# Patient Record
Sex: Male | Born: 1985 | Race: White | Hispanic: No | Marital: Single | Smoking: Never smoker
Health system: Southern US, Community
[De-identification: ages and names within clinical notes are randomized; demographics above are authoritative.]

## PROBLEM LIST (undated history)

## (undated) ENCOUNTER — Emergency Department (HOSPITAL_COMMUNITY): Admission: EM | Payer: Managed Care, Other (non HMO) | Source: Home / Self Care

---

## 2020-12-11 ENCOUNTER — Institutional Professional Consult (permissible substitution): Payer: Self-pay | Admitting: Plastic Surgery

## 2020-12-23 ENCOUNTER — Other Ambulatory Visit: Payer: Self-pay

## 2020-12-24 ENCOUNTER — Encounter: Payer: Self-pay | Admitting: Emergency Medicine

## 2020-12-24 ENCOUNTER — Ambulatory Visit: Payer: BC Managed Care – PPO | Admitting: Emergency Medicine

## 2020-12-24 VITALS — BP 120/80 | HR 61 | Temp 98.4°F | Ht 73.0 in | Wt 204.0 lb

## 2020-12-24 DIAGNOSIS — N62 Hypertrophy of breast: Secondary | ICD-10-CM | POA: Diagnosis not present

## 2020-12-24 DIAGNOSIS — Z8669 Personal history of other diseases of the nervous system and sense organs: Secondary | ICD-10-CM

## 2020-12-24 DIAGNOSIS — Z7689 Persons encountering health services in other specified circumstances: Secondary | ICD-10-CM | POA: Diagnosis not present

## 2020-12-24 NOTE — Patient Instructions (Signed)

## 2020-12-24 NOTE — Assessment & Plan Note (Signed)
Well-controlled stable.  Responsive to triptan medications.

## 2020-12-24 NOTE — Assessment & Plan Note (Signed)
Needs evaluation by plastic surgery for possible breast tissue reduction

## 2020-12-24 NOTE — Progress Notes (Signed)
Cody Bell 35 y.o.   Chief Complaint  Patient presents with  . New Patient (Initial Visit)   Date: 10/28/2020 Patient name: Cody Bell Date of birth: 01-26-1986  ASSESSMENT/PLAN:  Bryton was seen today for annual exam.  Diagnoses and all orders for this visit:  Encounter for annual physical exam - Doing well. Labs today. Vaccines UTD.  - CBC; Future - Comprehensive Metabolic Panel; Future - Lipid Panel With LDL/HDL Ratio; Future - TSH; Future - Urinalysis Complete with Microscopic; Future  Encounter for vitamin deficiency screening - Vitamin D 25 Hydroxy; Future  Gynecomastia - Chronic x 10 years. Exam overall okay but gynecomastia is present. Will refer to plastic surgery  - Testosterone, free & total; Future - hCG, Qualitative; Future - Ambulatory referral to Plastic Surgery  Follow up in about 1 year (around 10/28/2021) for annual physical.   HISTORY OF PRESENT ILLNESS: This is a 35 y.o. male first visit to this office here to establish care with me. Past medical history includes history of migraine headaches responsive to triptans. Also has history of painful gynecomastia. Non-smoker.  Healthy male with a healthy lifestyle.  HPI   Prior to Admission medications   Medication Sig Start Date End Date Taking? Authorizing Provider  AMINO ACIDS PO Take by mouth daily.   Yes [provider]  GLUCOSAMINE-CHONDROITIN PO Take by mouth daily.   Yes [provider]  Multiple Vitamin (MULTIVITAMIN) tablet Take 1 tablet by mouth daily.   Yes [provider]  Omega-3 Fatty Acids (FISH OIL) 1200 MG CAPS Take by mouth daily.   Yes [provider]  rizatriptan (MAXALT) 10 MG tablet Take 10 mg by mouth as needed for migraine. May repeat in 2 hours if needed   Yes [provider]    Allergies  Allergen Reactions  . Metoclopramide Hives  . Sulfa Antibiotics Hives  . Cephalosporins Rash  . Isosulfan Blue Rash    There  are no problems to display for this patient.   History reviewed. No pertinent past medical history.  History reviewed. No pertinent surgical history.  Social History   Socioeconomic History  . Marital status: Single    Spouse name: Not on file  . Number of children: Not on file  . Years of education: Not on file  . Highest education level: Not on file  Occupational History  . Not on file  Tobacco Use  . Smoking status: Never Smoker  . Smokeless tobacco: Never Used  Substance and Sexual Activity  . Alcohol use: Not Currently  . Drug use: Never  . Sexual activity: Not on file  Other Topics Concern  . Not on file  Social History Narrative  . Not on file   Social Determinants of Health   Financial Resource Strain: Not on file  Food Insecurity: Not on file  Transportation Needs: Not on file  Physical Activity: Not on file  Stress: Not on file  Social Connections: Not on file  Intimate Partner Violence: Not on file    History reviewed. No pertinent family history.   Review of Systems  Constitutional: Negative.  Negative for chills and fever.  HENT: Negative.  Negative for congestion and sore throat.   Respiratory: Negative.  Negative for cough and shortness of breath.   Cardiovascular: Negative.  Negative for chest pain and palpitations.  Gastrointestinal: Negative.  Negative for abdominal pain, diarrhea, nausea and vomiting.  Genitourinary: Negative.  Negative for dysuria and hematuria.  Skin: Negative.  Negative for rash.  Neurological: Negative.  Negative for dizziness and headaches.  All other systems reviewed and are negative.    Physical Exam Vitals reviewed.  Constitutional:      Appearance: Normal appearance.  HENT:     Head: Normocephalic.  Eyes:     Extraocular Movements: Extraocular movements intact.     Conjunctiva/sclera: Conjunctivae normal.     Pupils: Pupils are equal, round, and reactive to light.  Cardiovascular:     Rate and Rhythm:  Normal rate and regular rhythm.     Pulses: Normal pulses.     Heart sounds: Normal heart sounds.  Pulmonary:     Effort: Pulmonary effort is normal.     Breath sounds: Normal breath sounds.  Abdominal:     General: Bowel sounds are normal. There is no distension.     Palpations: Abdomen is soft.     Tenderness: There is no abdominal tenderness.  Musculoskeletal:        General: Normal range of motion.     Cervical back: Normal range of motion and neck supple.  Skin:    General: Skin is warm and dry.     Capillary Refill: Capillary refill takes less than 2 seconds.  Neurological:     General: No focal deficit present.     Mental Status: He is alert and oriented to person, place, and time.  Psychiatric:        Mood and Affect: Mood normal.        Behavior: Behavior normal.      ASSESSMENT & PLAN: A total of 30 minutes was spent with the patient and counseling/coordination of care regarding establishing care with me, review of past medical history, review of medications, need for plastic surgery consult for gynecomastia, education on nutrition, review of most recent office visit note with [facility], health maintenance items, prognosis, documentation, and need for follow-up.  History of migraine Well-controlled stable.  Responsive to triptan medications.  Gynecomastia, male Needs evaluation by plastic surgery for possible breast tissue reduction  [name] was seen today for new patient (initial visit).  Diagnoses and all orders for this visit:  Gynecomastia, male -     Ambulatory referral to Plastic Surgery  Encounter to establish care  History of migraine    Patient Instructions   Health Maintenance, Male Adopting a healthy lifestyle and getting preventive care are important in promoting health and wellness. Ask your health care provider about:  The right schedule for you to have regular tests and exams.  Things you can do on your own to prevent  diseases and keep yourself healthy. What should I know about diet, weight, and exercise? Eat a healthy diet  Eat a diet that includes plenty of vegetables, fruits, low-fat dairy products, and lean protein.  Do not eat a lot of foods that are high in solid fats, added sugars, or sodium.   Maintain a healthy weight Body mass index (BMI) is a measurement that can be used to identify possible weight problems. It estimates body fat based on height and weight. Your health care provider can help determine your BMI and help you achieve or maintain a healthy weight. Get regular exercise Get regular exercise. This is one of the most important things you can do for your health. Most adults should:  Exercise for at least 150 minutes each week. The exercise should increase your heart rate and make you sweat (moderate-intensity exercise).  Do strengthening exercises at least twice a week. This is in addition  to the moderate-intensity exercise.  Spend less time sitting. Even light physical activity can be beneficial. Watch cholesterol and blood lipids Have your blood tested for lipids and cholesterol at 34 years of age, then have this test every 5 years. You may need to have your cholesterol levels checked more often if:  Your lipid or cholesterol levels are high.  You are older than 34 years of age.  You are at high risk for heart disease. What should I know about cancer screening? Many types of cancers can be detected early and may often be prevented. Depending on your health history and family history, you may need to have cancer screening at various ages. This may include screening for:  Colorectal cancer.  Prostate cancer.  Skin cancer.  Lung cancer. What should I know about heart disease, diabetes, and high blood pressure? Blood pressure and heart disease  High blood pressure causes heart disease and increases the risk of stroke. This is more likely to develop in people who have high  blood pressure readings, are of African descent, or are overweight.  Talk with your health care provider about your target blood pressure readings.  Have your blood pressure checked: ? Every 3-5 years if you are 4418-34 years of age. ? Every year if you are 34 years old or older.  If you are between the ages of 9565 and 6475 and are a current or former smoker, ask your health care provider if you should have a one-time screening for abdominal aortic aneurysm (AAA). Diabetes Have regular diabetes screenings. This checks your fasting blood sugar level. Have the screening done:  Once every three years after age 34 if you are at a normal weight and have a low risk for diabetes.  More often and at a younger age if you are overweight or have a high risk for diabetes. What should I know about preventing infection? Hepatitis B If you have a higher risk for hepatitis B, you should be screened for this virus. Talk with your health care provider to find out if you are at risk for hepatitis B infection. Hepatitis C Blood testing is recommended for:  Everyone born from [birth year] through 1965.  Anyone with known risk factors for hepatitis C. Sexually transmitted infections (STIs)  You should be screened each year for STIs, including gonorrhea and chlamydia, if: ? You are sexually active and are younger than 34 years of age. ? You are older than 34 years of age and your health care provider tells you that you are at risk for this type of infection. ? Your sexual activity has changed since you were last screened, and you are at increased risk for chlamydia or gonorrhea. Ask your health care provider if you are at risk.  Ask your health care provider about whether you are at high risk for HIV. Your health care provider may recommend a prescription medicine to help prevent HIV infection. If you choose to take medicine to prevent HIV, you should first get tested for HIV. You should then be tested every 3 months for  as long as you are taking the medicine. Follow these instructions at home: Lifestyle  Do not use any products that contain nicotine or tobacco, such as cigarettes, e-cigarettes, and chewing tobacco. If you need help quitting, ask your health care provider.  Do not use street drugs.  Do not share needles.  Ask your health care provider for help if you need support or information about quitting drugs. Alcohol use  Do not drink alcohol if your health care provider tells you not to drink.  If you drink alcohol: ? Limit how much you have to 0-2 drinks a day. ? Be aware of how much alcohol is in your drink. In the [place] one drink equals one 12 oz bottle of beer (355 mL), one 5 oz glass of wine (148 mL), or one 1 oz glass of hard liquor (44 mL). General instructions  Schedule regular health, dental, and eye exams.  Stay current with your vaccines.  Tell your health care provider if: ? You often feel depressed. ? You have ever been abused or do not feel safe at home. Summary  Adopting a healthy lifestyle and getting preventive care are important in promoting health and wellness.  Follow your health care provider's instructions about healthy diet, exercising, and getting tested or screened for diseases.  Follow your health care provider's instructions on monitoring your cholesterol and blood pressure. This information is not intended to replace advice given to you by your health care provider. Make sure you discuss any questions you have with your health care provider. Document Revised: 06/28/2018 Document Reviewed: 06/28/2018 Elsevier Patient Education  2021 Elsevier Inc.     Edwina Barth, MD [facility] at [facility]

## 2020-12-29 ENCOUNTER — Encounter: Payer: Self-pay | Admitting: Emergency Medicine

## 2021-01-01 ENCOUNTER — Other Ambulatory Visit: Payer: Self-pay | Admitting: Emergency Medicine

## 2021-01-01 DIAGNOSIS — R399 Unspecified symptoms and signs involving the genitourinary system: Secondary | ICD-10-CM

## 2021-01-01 NOTE — Telephone Encounter (Signed)
Chronic lower urinary tract symptoms for years.  Possible overactive bladder.  Needs evaluation by urologist.  Referral placed today.  Thanks.

## 2021-01-16 ENCOUNTER — Encounter: Payer: Self-pay | Admitting: Emergency Medicine

## 2021-01-19 ENCOUNTER — Other Ambulatory Visit: Payer: Self-pay | Admitting: Emergency Medicine

## 2021-01-19 MED ORDER — RIZATRIPTAN BENZOATE 10 MG PO TABS: 10.0000 mg | ORAL_TABLET | ORAL | 10 refills | Status: DC | PRN

## 2021-01-19 NOTE — Telephone Encounter (Signed)
Prescription sent to pharmacy of record.  Thanks.

## 2021-01-29 ENCOUNTER — Institutional Professional Consult (permissible substitution): Payer: BC Managed Care – PPO | Admitting: Plastic Surgery

## 2021-06-26 ENCOUNTER — Encounter: Payer: Self-pay | Admitting: Emergency Medicine

## 2021-07-01 NOTE — Telephone Encounter (Signed)
Called patient and left vm stating to callback to schedule appointment.

## 2021-10-10 ENCOUNTER — Encounter (HOSPITAL_COMMUNITY): Payer: Self-pay | Admitting: Emergency Medicine

## 2021-10-10 ENCOUNTER — Emergency Department (HOSPITAL_COMMUNITY)
Admission: EM | Admit: 2021-10-10 | Discharge: 2021-10-10 | Disposition: A | Discharge status: Home / Self Care | Payer: Managed Care, Other (non HMO) | Attending: Emergency Medicine | Admitting: Emergency Medicine

## 2021-10-10 ENCOUNTER — Emergency Department (HOSPITAL_COMMUNITY): Payer: Managed Care, Other (non HMO)

## 2021-10-10 ENCOUNTER — Other Ambulatory Visit: Payer: Self-pay

## 2021-10-10 DIAGNOSIS — W134XXA Fall from, out of or through window, initial encounter: Secondary | ICD-10-CM | POA: Insufficient documentation

## 2021-10-10 DIAGNOSIS — S2241XA Multiple fractures of ribs, right side, initial encounter for closed fracture: Secondary | ICD-10-CM | POA: Insufficient documentation

## 2021-10-10 DIAGNOSIS — Y9339 Activity, other involving climbing, rappelling and jumping off: Secondary | ICD-10-CM | POA: Insufficient documentation

## 2021-10-10 DIAGNOSIS — S299XXA Unspecified injury of thorax, initial encounter: Secondary | ICD-10-CM | POA: Diagnosis present

## 2021-10-10 MED ORDER — HYDROCODONE-ACETAMINOPHEN 5-325 MG PO TABS: 1.0000 | ORAL_TABLET | Freq: Four times a day (QID) | ORAL | 0 refills | Status: DC | PRN

## 2021-10-10 NOTE — Discharge Instructions (Addendum)
You have been seen and discharged from the emergency department.  Your x-ray showed hairline rib fractures.  Take 600 to 800 mg of ibuprofen every 6-8 hours with food and drink.  Take stronger pain medicine as needed.  Do not mix this medication with alcohol or other sedating medications. Do not drive or do heavy physical activity until you know how this medication affects you.  It may cause drowsiness..  Follow-up with your primary provider for further evaluation and further care. Take home medications as prescribed. If you have any worsening symptoms, worsening pain, shortness of breath, coughing up blood or further concerns for your health please return to an emergency department for further evaluation. ?

## 2021-10-10 NOTE — ED Notes (Signed)
Pt verbalizes understanding of discharge instructions. Opportunity for questions and answers were provided. Pt discharged from the ED.   ?
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
## 2021-10-12 ENCOUNTER — Telehealth: Payer: Managed Care, Other (non HMO) | Admitting: Emergency Medicine

## 2021-10-12 ENCOUNTER — Other Ambulatory Visit: Payer: Self-pay

## 2021-10-12 ENCOUNTER — Telehealth: Payer: Self-pay | Admitting: Emergency Medicine

## 2021-10-12 NOTE — Telephone Encounter (Signed)
Phone call. No answer.

## 2021-10-16 ENCOUNTER — Encounter: Payer: Self-pay | Admitting: Family Medicine

## 2021-10-16 ENCOUNTER — Telehealth (INDEPENDENT_AMBULATORY_CARE_PROVIDER_SITE_OTHER): Payer: Managed Care, Other (non HMO) | Admitting: Family Medicine

## 2021-10-16 DIAGNOSIS — S2241XA Multiple fractures of ribs, right side, initial encounter for closed fracture: Secondary | ICD-10-CM | POA: Diagnosis not present

## 2021-10-16 MED ORDER — HYDROCODONE-ACETAMINOPHEN 10-325 MG PO TABS: 1.0000 | ORAL_TABLET | Freq: Three times a day (TID) | ORAL | 0 refills | Status: AC | PRN
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
## 2021-10-28 ENCOUNTER — Telehealth: Payer: Self-pay | Admitting: Emergency Medicine

## 2021-10-29 ENCOUNTER — Ambulatory Visit: Payer: Managed Care, Other (non HMO) | Admitting: Family Medicine

## 2021-12-29 ENCOUNTER — Telehealth (INDEPENDENT_AMBULATORY_CARE_PROVIDER_SITE_OTHER): Payer: Managed Care, Other (non HMO) | Admitting: Emergency Medicine

## 2021-12-29 ENCOUNTER — Encounter: Payer: Self-pay | Admitting: Emergency Medicine

## 2021-12-29 DIAGNOSIS — T25221A Burn of second degree of right foot, initial encounter: Secondary | ICD-10-CM

## 2021-12-29 MED ORDER — AZITHROMYCIN 250 MG PO TABS: ORAL_TABLET | ORAL | 0 refills | Status: DC

## 2021-12-29 MED ORDER — TRAMADOL HCL 50 MG PO TABS: 50.0000 mg | ORAL_TABLET | Freq: Three times a day (TID) | ORAL | 0 refills | Status: AC | PRN

## 2021-12-29 MED ORDER — SILVER SULFADIAZINE 1 % EX CREA: 1.0000 "application " | TOPICAL_CREAM | Freq: Every day | CUTANEOUS | 1 refills | Status: DC

## 2021-12-29 NOTE — Telephone Encounter (Signed)
Thank you.  I referred him also to wound care center.  Strongly advise he follows up on that.
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
## 2022-01-07 ENCOUNTER — Telehealth: Payer: Managed Care, Other (non HMO) | Admitting: Emergency Medicine

## 2022-01-27 ENCOUNTER — Telehealth: Payer: Self-pay | Admitting: Emergency Medicine

## 2022-01-27 ENCOUNTER — Other Ambulatory Visit: Payer: Self-pay | Admitting: Emergency Medicine

## 2022-01-27 MED ORDER — RIZATRIPTAN BENZOATE 10 MG PO TABS
10.0000 mg | ORAL_TABLET | ORAL | 10 refills | Status: DC | PRN
Start: 2022-01-27 — End: 2022-08-09

## 2022-01-27 NOTE — Telephone Encounter (Signed)
Caller & Relationship to patient: Andrik - self  Call back number: [phone]  Date of last office visit: 12/29/21  Date of next office visit:   Medication(s) to be refilled: rizatriptan (MAXALT) 10 MG tablet  Preferred Pharmacy: [facility] [address] [place], [place] - [address] [facility] Phone:  [phone]  Fax:  [phone]    Patient is requesting 10 refills be sent and a call from medical assistant when refill has been sent.

## 2022-01-27 NOTE — Telephone Encounter (Signed)
New prescription sent to pharmacy on record.  Thanks.

## 2022-02-11 ENCOUNTER — Other Ambulatory Visit: Payer: Self-pay | Admitting: Emergency Medicine

## 2022-02-11 DIAGNOSIS — T25221A Burn of second degree of right foot, initial encounter: Secondary | ICD-10-CM

## 2022-02-18 ENCOUNTER — Encounter: Payer: Self-pay | Admitting: Family Medicine

## 2022-02-18 ENCOUNTER — Other Ambulatory Visit: Payer: Self-pay | Admitting: Emergency Medicine

## 2022-02-18 ENCOUNTER — Encounter: Payer: Self-pay | Admitting: Emergency Medicine

## 2022-02-18 ENCOUNTER — Telehealth: Payer: Managed Care, Other (non HMO)

## 2022-02-18 MED ORDER — VALACYCLOVIR HCL 1 G PO TABS: 1000.0000 mg | ORAL_TABLET | Freq: Two times a day (BID) | ORAL | 1 refills | Status: DC

## 2022-02-18 NOTE — Telephone Encounter (Signed)
New prescription sent to pharmacy of record

## 2022-02-18 NOTE — Telephone Encounter (Signed)
Message was handled by PCP

## 2022-05-02 ENCOUNTER — Encounter: Payer: Self-pay | Admitting: Emergency Medicine

## 2022-05-11 ENCOUNTER — Encounter: Payer: Managed Care, Other (non HMO) | Admitting: Emergency Medicine

## 2022-05-25 ENCOUNTER — Ambulatory Visit (INDEPENDENT_AMBULATORY_CARE_PROVIDER_SITE_OTHER): Payer: Managed Care, Other (non HMO) | Admitting: Emergency Medicine

## 2022-05-25 ENCOUNTER — Encounter: Payer: Self-pay | Admitting: Emergency Medicine

## 2022-05-25 VITALS — BP 108/72 | HR 76 | Temp 98.1°F | Ht 73.0 in | Wt 200.2 lb

## 2022-05-25 DIAGNOSIS — Z114 Encounter for screening for human immunodeficiency virus [HIV]: Secondary | ICD-10-CM

## 2022-05-25 DIAGNOSIS — Z13220 Encounter for screening for lipoid disorders: Secondary | ICD-10-CM

## 2022-05-25 DIAGNOSIS — Z130 Encounter for screening for diseases of the blood and blood-forming organs and certain disorders involving the immune mechanism: Secondary | ICD-10-CM | POA: Diagnosis not present

## 2022-05-25 DIAGNOSIS — Z0000 Encounter for general adult medical examination without abnormal findings: Secondary | ICD-10-CM | POA: Diagnosis not present

## 2022-05-25 DIAGNOSIS — Z13228 Encounter for screening for other metabolic disorders: Secondary | ICD-10-CM

## 2022-05-25 DIAGNOSIS — Z1329 Encounter for screening for other suspected endocrine disorder: Secondary | ICD-10-CM

## 2022-05-25 DIAGNOSIS — Z1159 Encounter for screening for other viral diseases: Secondary | ICD-10-CM | POA: Diagnosis not present

## 2022-05-25 DIAGNOSIS — N62 Hypertrophy of breast: Secondary | ICD-10-CM

## 2022-05-25 NOTE — Progress Notes (Signed)
Cody Bell 36 y.o.   Chief Complaint  Patient presents with   Annual Exam    Surgical clearance, patient needs a note stating that he is cleared     HISTORY OF PRESENT ILLNESS: This is a 36 y.o. male here for annual exam. History of bilateral gynecomastia.  Will be scheduled for surgery.  Needs referral to [facility]. Overall doing well. No chronic medical conditions.  No chronic medications. Recent cardiac evaluation due to family history of PFO revealed normal heart findings.  HPI   Prior to Admission medications   Medication Sig Start Date End Date Taking? Authorizing Provider  AMINO ACIDS PO Take by mouth daily.    [provider]  azithromycin (ZITHROMAX) 250 MG tablet Sig as indicated 12/29/21   Horald Pollen, MD  GLUCOSAMINE-CHONDROITIN PO Take by mouth daily.    [provider]  Multiple Vitamin (MULTIVITAMIN) tablet Take 1 tablet by mouth daily.    [provider]  Omega-3 Fatty Acids (FISH OIL) 1200 MG CAPS Take by mouth daily.    [provider]  rizatriptan (MAXALT) 10 MG tablet Take 1 tablet (10 mg total) by mouth as needed for migraine. May repeat in 2 hours if needed 01/27/22   Horald Pollen, MD  SSD 1 % cream APPLY TOPICALLY ONCE DAILY 02/11/22   Horald Pollen, MD  valACYclovir (VALTREX) 1000 MG tablet Take 1 tablet (1,000 mg total) by mouth 2 (two) times daily. 02/18/22   Horald Pollen, MD    Allergies  Allergen Reactions   Metoclopramide Hives   Sulfa Antibiotics Hives   Cephalosporins Rash   Isosulfan Blue Rash    Patient Active Problem List   Diagnosis Date Noted   Gynecomastia, male 12/24/2020   History of migraine 12/24/2020    No past medical history on file.  No past surgical history on file.  Social History   Socioeconomic History   Marital status: Single    Spouse name: Not on file   Number of children: Not on file   Years of education: Not on file    Highest education level: Not on file  Occupational History   Not on file  Tobacco Use   Smoking status: Never   Smokeless tobacco: Never  Substance and Sexual Activity   Alcohol use: Not Currently   Drug use: Never   Sexual activity: Not on file  Other Topics Concern   Not on file  Social History Narrative   Not on file   Social Determinants of Health   Financial Resource Strain: Not on file  Food Insecurity: Not on file  Transportation Needs: Not on file  Physical Activity: Not on file  Stress: Not on file  Social Connections: Not on file  Intimate Partner Violence: Not on file    No family history on file.   Review of Systems  Constitutional: Negative.  Negative for chills and fever.  HENT: Negative.  Negative for congestion and sore throat.   Respiratory: Negative.  Negative for cough and shortness of breath.   Cardiovascular: Negative.  Negative for chest pain and palpitations.  Gastrointestinal: Negative.  Negative for abdominal pain, diarrhea, nausea and vomiting.  Genitourinary: Negative.   Skin: Negative.  Negative for rash.  Neurological: Negative.  Negative for dizziness and tingling.  All other systems reviewed and are negative.   Today's Vitals   05/25/22 1309  BP: 108/72  Pulse: 76  Temp: 98.1 F (36.7 C)  TempSrc: Oral  SpO2: 97%  Weight: 200 lb 4 oz (90.8 kg)  Height: 6\' 1"  (1.854 m)   Body mass index is 26.42 kg/m.  Physical Exam Vitals reviewed.  Constitutional:      Appearance: Normal appearance.  HENT:     Head: Normocephalic.     Right Ear: Tympanic membrane, ear canal and external ear normal.     Left Ear: Tympanic membrane, ear canal and external ear normal.     Mouth/Throat:     Mouth: Mucous membranes are moist.     Pharynx: Oropharynx is clear.  Eyes:     Extraocular Movements: Extraocular movements intact.     Conjunctiva/sclera: Conjunctivae normal.     Pupils: Pupils are equal, round, and reactive to light.   Cardiovascular:     Rate and Rhythm: Normal rate and regular rhythm.     Pulses: Normal pulses.     Heart sounds: Normal heart sounds.  Pulmonary:     Effort: Pulmonary effort is normal.     Breath sounds: Normal breath sounds.  Abdominal:     Palpations: Abdomen is soft.     Tenderness: There is no abdominal tenderness.  Musculoskeletal:        General: Normal range of motion.     Cervical back: No tenderness.     Right lower leg: No edema.     Left lower leg: No edema.  Lymphadenopathy:     Cervical: No cervical adenopathy.  Skin:    General: Skin is warm and dry.     Comments: Bilateral gynecomastia  Neurological:     General: No focal deficit present.     Mental Status: He is alert and oriented to person, place, and time.  Psychiatric:        Mood and Affect: Mood normal.        Behavior: Behavior normal.      ASSESSMENT & PLAN: Problem List Items Addressed This Visit       Other   Gynecomastia, male   Relevant Orders   Ambulatory referral to Plastic Surgery   Other Visit Diagnoses     Routine general medical examination at a health care facility    -  Primary   Screening for HIV (human immunodeficiency virus)       Relevant Orders   HIV antibody   Need for hepatitis C screening test       Relevant Orders   Hepatitis C antibody screen   Screening for deficiency anemia       Relevant Orders   CBC with Differential   Screening for lipoid disorders       Relevant Orders   Lipid panel   Screening for endocrine, metabolic and immunity disorder       Relevant Orders   Comprehensive metabolic panel   Hemoglobin A1c      Modifiable risk factors discussed with patient. Anticipatory guidance according to age provided. The following topics were also discussed: Social Determinants of Health Smoking.  Non-smoker Diet and nutrition.  Good eating habits Benefits of exercise.  Exercises regularly.  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 Ask your health care provider about: The right schedule for you to have regular tests and exams. Things you can do on your  own to prevent diseases and keep yourself healthy. What should I know about diet, weight, and exercise? Eat a healthy diet  Eat a diet that includes plenty of vegetables, fruits, low-fat dairy products, and lean protein. Do not eat a lot of foods that are high in solid fats, added sugars, or sodium. Maintain a healthy weight Body mass index (BMI) is a measurement that can be used to identify possible weight problems. It estimates body fat based on height and weight. Your health care provider can help determine your BMI and help you achieve or maintain a healthy weight. Get regular exercise Get regular exercise. This is one of the most important things you can do for your health. Most adults should: Exercise for at least 150 minutes each week. The exercise should increase your heart rate and make you sweat (moderate-intensity exercise). Do strengthening exercises at least twice a week. This is in addition to the moderate-intensity exercise. Spend less time sitting. Even light physical activity can be beneficial. Watch cholesterol and blood lipids Have your blood tested for lipids and cholesterol at 35 years of age, then have this test every 5 years. You may need to have your cholesterol levels checked more often if: Your lipid or cholesterol levels are high. You are older than 35 years of age. You are at high risk for heart disease. What should I know about cancer screening? Many types of cancers can be detected early and may often be prevented. Depending on  your health history and family history, you may need to have cancer screening at various ages. This may include screening for: Colorectal cancer. Prostate cancer. Skin cancer. Lung cancer. What should I know about heart disease, diabetes, and high blood pressure? Blood pressure and heart disease High blood pressure causes heart disease and increases the risk of stroke. This is more likely to develop in people who have high blood pressure readings or are overweight. Talk with your health care provider about your target blood pressure readings. Have your blood pressure checked: Every 3-5 years if you are 70-9 years of age. Every year if you are 39 years old or older. If you are between the ages of 47 and 94 and are a current or former smoker, ask your health care provider if you should have a one-time screening for abdominal aortic aneurysm (AAA). Diabetes Have regular diabetes screenings. This checks your fasting blood sugar level. Have the screening done: Once every three years after age 67 if you are at a normal weight and have a low risk for diabetes. More often and at a younger age if you are overweight or have a high risk for diabetes. What should I know about preventing infection? Hepatitis B If you have a higher risk for hepatitis B, you should be screened for this virus. Talk with your health care provider to find out if you are at risk for hepatitis B infection. Hepatitis C Blood testing is recommended for: Everyone born from [birth year] through 1964. Anyone with known risk factors for hepatitis C. Sexually transmitted infections (STIs) You should be screened each year for STIs, including gonorrhea and chlamydia, if: You are sexually active and are younger than 35 years of age. You are older than 35 years of age and your health care provider tells you that you are at risk for this type of infection. Your sexual activity has changed since you were last screened, and you are at increased  risk for chlamydia or gonorrhea. Ask your health care provider  if you are at risk. Ask your health care provider about whether you are at high risk for HIV. Your health care provider may recommend a prescription medicine to help prevent HIV infection. If you choose to take medicine to prevent HIV, you should first get tested for HIV. You should then be tested every 3 months for as long as you are taking the medicine. Follow these instructions at home: Alcohol use Do not drink alcohol if your health care provider tells you not to drink. If you drink alcohol: Limit how much you have to 0-2 drinks a day. Know how much alcohol is in your drink. In the [place] one drink equals one 12 oz bottle of beer (355 mL), one 5 oz glass of wine (148 mL), or one 1 oz glass of hard liquor (44 mL). Lifestyle Do not use any products that contain nicotine or tobacco. These products include cigarettes, chewing tobacco, and vaping devices, such as e-cigarettes. If you need help quitting, ask your health care provider. Do not use street drugs. Do not share needles. Ask your health care provider for help if you need support or information about quitting drugs. General instructions Schedule regular health, dental, and eye exams. Stay current with your vaccines. Tell your health care provider if: You often feel depressed. You have ever been abused or do not feel safe at home. Summary Adopting a healthy lifestyle and getting preventive care are important in promoting health and wellness. Follow your health care provider's instructions about healthy diet, exercising, and getting tested or screened for diseases. Follow your health care provider's instructions on monitoring your cholesterol and blood pressure. This information is not intended to replace advice given to you by your health care provider. Make sure you discuss any questions you have with your health care provider. Document Revised: 11/24/2020 Document  Reviewed: 11/24/2020 Elsevier Patient Education  [place], MD [facility]

## 2022-05-25 NOTE — Patient Instructions (Signed)
Health Maintenance, Male Adopting a healthy lifestyle and getting preventive care are important in promoting health and wellness. Ask your health care provider about: The right schedule for you to have regular tests and exams. Things you can do on your own to prevent diseases and keep yourself healthy. What should I know about diet, weight, and exercise? Eat a healthy diet  Eat a diet that includes plenty of vegetables, fruits, low-fat dairy products, and lean protein. Do not eat a lot of foods that are high in solid fats, added sugars, or sodium. Maintain a healthy weight Body mass index (BMI) is a measurement that can be used to identify possible weight problems. It estimates body fat based on height and weight. Your health care provider can help determine your BMI and help you achieve or maintain a healthy weight. Get regular exercise Get regular exercise. This is one of the most important things you can do for your health. Most adults should: Exercise for at least 150 minutes each week. The exercise should increase your heart rate and make you sweat (moderate-intensity exercise). Do strengthening exercises at least twice a week. This is in addition to the moderate-intensity exercise. Spend less time sitting. Even light physical activity can be beneficial. Watch cholesterol and blood lipids Have your blood tested for lipids and cholesterol at 35 years of age, then have this test every 5 years. You may need to have your cholesterol levels checked more often if: Your lipid or cholesterol levels are high. You are older than 35 years of age. You are at high risk for heart disease. What should I know about cancer screening? Many types of cancers can be detected early and may often be prevented. Depending on your health history and family history, you may need to have cancer screening at various ages. This may include screening for: Colorectal cancer. Prostate cancer. Skin cancer. Lung  cancer. What should I know about heart disease, diabetes, and high blood pressure? Blood pressure and heart disease High blood pressure causes heart disease and increases the risk of stroke. This is more likely to develop in people who have high blood pressure readings or are overweight. Talk with your health care provider about your target blood pressure readings. Have your blood pressure checked: Every 3-5 years if you are 18-39 years of age. Every year if you are 40 years old or older. If you are between the ages of 65 and 75 and are a current or former smoker, ask your health care provider if you should have a one-time screening for abdominal aortic aneurysm (AAA). Diabetes Have regular diabetes screenings. This checks your fasting blood sugar level. Have the screening done: Once every three years after age 45 if you are at a normal weight and have a low risk for diabetes. More often and at a younger age if you are overweight or have a high risk for diabetes. What should I know about preventing infection? Hepatitis B If you have a higher risk for hepatitis B, you should be screened for this virus. Talk with your health care provider to find out if you are at risk for hepatitis B infection. Hepatitis C Blood testing is recommended for: Everyone born from 1945 through 1965. Anyone with known risk factors for hepatitis C. Sexually transmitted infections (STIs) You should be screened each year for STIs, including gonorrhea and chlamydia, if: You are sexually active and are younger than 35 years of age. You are older than 35 years of age and your   health care provider tells you that you are at risk for this type of infection. Your sexual activity has changed since you were last screened, and you are at increased risk for chlamydia or gonorrhea. Ask your health care provider if you are at risk. Ask your health care provider about whether you are at high risk for HIV. Your health care provider  may recommend a prescription medicine to help prevent HIV infection. If you choose to take medicine to prevent HIV, you should first get tested for HIV. You should then be tested every 3 months for as long as you are taking the medicine. Follow these instructions at home: Alcohol use Do not drink alcohol if your health care provider tells you not to drink. If you drink alcohol: Limit how much you have to 0-2 drinks a day. Know how much alcohol is in your drink. In the [place] one drink equals one 12 oz bottle of beer (355 mL), one 5 oz glass of wine (148 mL), or one 1 oz glass of hard liquor (44 mL). Lifestyle Do not use any products that contain nicotine or tobacco. These products include cigarettes, chewing tobacco, and vaping devices, such as e-cigarettes. If you need help quitting, ask your health care provider. Do not use street drugs. Do not share needles. Ask your health care provider for help if you need support or information about quitting drugs. General instructions Schedule regular health, dental, and eye exams. Stay current with your vaccines. Tell your health care provider if: You often feel depressed. You have ever been abused or do not feel safe at home. Summary Adopting a healthy lifestyle and getting preventive care are important in promoting health and wellness. Follow your health care provider's instructions about healthy diet, exercising, and getting tested or screened for diseases. Follow your health care provider's instructions on monitoring your cholesterol and blood pressure. This information is not intended to replace advice given to you by your health care provider. Make sure you discuss any questions you have with your health care provider. Document Revised: 11/24/2020 Document Reviewed: 11/24/2020 Elsevier Patient Education  2023 [employer] Inc[employer]  

## 2022-07-29 ENCOUNTER — Telehealth: Payer: Self-pay | Admitting: Emergency Medicine

## 2022-07-29 ENCOUNTER — Other Ambulatory Visit: Payer: Self-pay | Admitting: Emergency Medicine

## 2022-07-29 ENCOUNTER — Encounter: Payer: Self-pay | Admitting: Emergency Medicine

## 2022-07-29 DIAGNOSIS — N62 Hypertrophy of breast: Secondary | ICD-10-CM

## 2022-07-29 NOTE — Telephone Encounter (Signed)
Orders for labs requested placed today.  Thanks.

## 2022-07-29 NOTE — Telephone Encounter (Signed)
Patient called and stated he spoke with his insurance, [employer], in regards to them denying his request for a surgery. He states that he needs his primary doctor to supply them with a blood panel to check that his thyroid and hormones are level before he can have the surgery. He states that his primary would need to call the mainline number for [employer] to do a peer-to-peer talk so that they could overturn the decision. If there are any questions the patient can be reached at [phone].

## 2022-07-29 NOTE — Telephone Encounter (Signed)
Called patient and informed him that the labs are in

## 2022-07-29 NOTE — Telephone Encounter (Signed)
Called patient for clarification on below message. Insurance is requesting hormonal  labs to be drawn before surgery can be approved.   These labs are Hypothyrodism, estrogen excess, prolatinomas, hypogonadism. Also needs a referral to endocrinology. I informed patient the endocrinology is scheduling out a few months. He states the referral to endo is ok to do, wants to know if provider can order labs  in the meantime

## 2022-08-02 ENCOUNTER — Other Ambulatory Visit: Payer: Managed Care, Other (non HMO)

## 2022-08-02 DIAGNOSIS — N62 Hypertrophy of breast: Secondary | ICD-10-CM

## 2022-08-06 ENCOUNTER — Encounter: Payer: Self-pay | Admitting: Emergency Medicine

## 2022-08-09 MED ORDER — RIZATRIPTAN BENZOATE 10 MG PO TABS: 10.0000 mg | ORAL_TABLET | ORAL | 12 refills | Status: DC | PRN

## 2022-08-09 NOTE — Addendum Note (Signed)
Addended by: Rae Mar on: 08/09/2022 02:36 PM   Modules accepted: Orders

## 2022-08-11 LAB — TESTT+TESTF+SHBG
Sex Hormone Binding: 31.4 nmol/L (ref 16.5–55.9)
Testosterone, Free: 6 pg/mL — ABNORMAL LOW (ref 8.7–25.1)
Testosterone, Total, LC/MS: 341.8 ng/dL (ref 264.0–916.0)

## 2022-08-11 LAB — TSH: TSH: 2.14 u[IU]/mL (ref 0.450–4.500)

## 2022-08-11 LAB — ESTROGENS, TOTAL: Estrogen: 150 pg/mL (ref 56–213)

## 2022-08-11 LAB — PROLACTIN: Prolactin: 20.2 ng/mL (ref 3.9–22.7)

## 2022-12-01 ENCOUNTER — Encounter: Payer: Self-pay | Admitting: Emergency Medicine

## 2022-12-08 ENCOUNTER — Ambulatory Visit: Payer: Managed Care, Other (non HMO) | Admitting: Emergency Medicine

## 2022-12-26 ENCOUNTER — Encounter: Payer: Self-pay | Admitting: Emergency Medicine

## 2022-12-27 ENCOUNTER — Other Ambulatory Visit: Payer: Self-pay | Admitting: Emergency Medicine

## 2022-12-27 ENCOUNTER — Other Ambulatory Visit: Payer: Self-pay | Admitting: *Deleted

## 2022-12-27 MED ORDER — ACYCLOVIR 800 MG PO TABS: 800.0000 mg | ORAL_TABLET | Freq: Four times a day (QID) | ORAL | 1 refills | Status: DC

## 2022-12-27 MED ORDER — VALACYCLOVIR HCL 1 G PO TABS: 1000.0000 mg | ORAL_TABLET | Freq: Two times a day (BID) | ORAL | 0 refills | Status: DC

## 2022-12-27 NOTE — Telephone Encounter (Signed)
Prescription sent to wrong pharmacy, please send to: [facility] [phone]  Phone: [phone]  Fax: [phone]

## 2022-12-27 NOTE — Telephone Encounter (Signed)
New prescription for acyclovir sent to pharmacy of record today.  Thanks.

## 2022-12-27 NOTE — Telephone Encounter (Signed)
Patient is requesting a different medication than the Valtrex. He does not want the Valacydovir. Please send to [facility] - [address] [place], [place] - [address]   Call back if any questions - [phone]

## 2022-12-27 NOTE — Telephone Encounter (Signed)
New prescription for valacyclovir sent to pharmacy of record.  Thanks.

## 2023-04-03 ENCOUNTER — Encounter: Payer: Self-pay | Admitting: Emergency Medicine

## 2023-04-04 ENCOUNTER — Other Ambulatory Visit: Payer: Self-pay | Admitting: *Deleted

## 2023-04-04 MED ORDER — ACYCLOVIR 800 MG PO TABS: 800.0000 mg | ORAL_TABLET | Freq: Four times a day (QID) | ORAL | 1 refills | Status: DC

## 2023-04-06 ENCOUNTER — Telehealth: Payer: Self-pay | Admitting: Emergency Medicine

## 2023-04-06 ENCOUNTER — Other Ambulatory Visit: Payer: Self-pay | Admitting: *Deleted

## 2023-04-06 DIAGNOSIS — Z130 Encounter for screening for diseases of the blood and blood-forming organs and certain disorders involving the immune mechanism: Secondary | ICD-10-CM

## 2023-04-06 DIAGNOSIS — Z13220 Encounter for screening for lipoid disorders: Secondary | ICD-10-CM

## 2023-04-06 DIAGNOSIS — N62 Hypertrophy of breast: Secondary | ICD-10-CM

## 2023-04-06 NOTE — Telephone Encounter (Signed)
Called patient and informed him that his lab orders has been placed

## 2023-04-06 NOTE — Telephone Encounter (Signed)
Okay to add

## 2023-04-06 NOTE — Telephone Encounter (Signed)
Pt called wanting the full blood work panel done asap. Please advise once the orders has been put in.

## 2023-04-08 ENCOUNTER — Other Ambulatory Visit (INDEPENDENT_AMBULATORY_CARE_PROVIDER_SITE_OTHER): Payer: Managed Care, Other (non HMO)

## 2023-04-08 DIAGNOSIS — N62 Hypertrophy of breast: Secondary | ICD-10-CM

## 2023-04-08 DIAGNOSIS — Z1329 Encounter for screening for other suspected endocrine disorder: Secondary | ICD-10-CM

## 2023-04-08 DIAGNOSIS — Z130 Encounter for screening for diseases of the blood and blood-forming organs and certain disorders involving the immune mechanism: Secondary | ICD-10-CM | POA: Diagnosis not present

## 2023-04-08 DIAGNOSIS — Z13220 Encounter for screening for lipoid disorders: Secondary | ICD-10-CM | POA: Diagnosis not present

## 2023-04-08 DIAGNOSIS — Z13228 Encounter for screening for other metabolic disorders: Secondary | ICD-10-CM

## 2023-04-08 LAB — CBC WITH DIFFERENTIAL/PLATELET
Basophils Absolute: 0.1 10*3/uL (ref 0.0–0.1)
Basophils Relative: 2 % (ref 0.0–3.0)
Eosinophils Absolute: 0.1 10*3/uL (ref 0.0–0.7)
Eosinophils Relative: 2.4 % (ref 0.0–5.0)
HCT: 45.7 % (ref 39.0–52.0)
Hemoglobin: 15.5 g/dL (ref 13.0–17.0)
Lymphocytes Relative: 37.9 % (ref 12.0–46.0)
Lymphs Abs: 2.1 10*3/uL (ref 0.7–4.0)
MCHC: 33.8 g/dL (ref 30.0–36.0)
MCV: 86.4 fl (ref 78.0–100.0)
Monocytes Absolute: 0.4 10*3/uL (ref 0.1–1.0)
Monocytes Relative: 7 % (ref 3.0–12.0)
Neutro Abs: 2.8 10*3/uL (ref 1.4–7.7)
Neutrophils Relative %: 50.7 % (ref 43.0–77.0)
Platelets: 231 10*3/uL (ref 150.0–400.0)
RBC: 5.3 Mil/uL (ref 4.22–5.81)
RDW: 12.9 % (ref 11.5–15.5)
WBC: 5.6 10*3/uL (ref 4.0–10.5)

## 2023-04-08 LAB — COMPREHENSIVE METABOLIC PANEL
ALT: 31 U/L (ref 0–53)
AST: 21 U/L (ref 0–37)
Albumin: 4.6 g/dL (ref 3.5–5.2)
Alkaline Phosphatase: 66 U/L (ref 39–117)
BUN: 16 mg/dL (ref 6–23)
CO2: 27 mEq/L (ref 19–32)
Calcium: 9.7 mg/dL (ref 8.4–10.5)
Chloride: 99 mEq/L (ref 96–112)
Creatinine, Ser: 0.94 mg/dL (ref 0.40–1.50)
GFR: 104.3 mL/min (ref >60.00)
Glucose, Bld: 90 mg/dL (ref 70–99)
Potassium: 4 mEq/L (ref 3.5–5.1)
Sodium: 136 mEq/L (ref 135–145)
Total Bilirubin: 0.5 mg/dL (ref 0.2–1.2)
Total Protein: 7.6 g/dL (ref 6.0–8.3)

## 2023-04-08 LAB — LIPID PANEL
Cholesterol: 228 mg/dL — ABNORMAL HIGH (ref 0–200)
HDL: 51.5 mg/dL (ref >39.00)
LDL Cholesterol: 119 mg/dL — ABNORMAL HIGH (ref 0–99)
NonHDL: 176.73
Total CHOL/HDL Ratio: 4
Triglycerides: 291 mg/dL — ABNORMAL HIGH (ref 0.0–149.0)
VLDL: 58.2 mg/dL — ABNORMAL HIGH (ref 0.0–40.0)

## 2023-04-08 LAB — TSH: TSH: 3.36 u[IU]/mL (ref 0.35–5.50)

## 2023-04-08 LAB — HEMOGLOBIN A1C: Hgb A1c MFr Bld: 5.5 % (ref 4.6–6.5)

## 2023-04-12 LAB — PROLACTIN: Prolactin: 7.9 ng/mL (ref 2.0–18.0)

## 2023-04-12 LAB — VITAMIN D 25 HYDROXY (VIT D DEFICIENCY, FRACTURES): Vit D, 25-Hydroxy: 41 ng/mL (ref 30–100)

## 2023-04-12 LAB — ESTROGENS, TOTAL: Estrogen: 161 pg/mL (ref <=404)

## 2023-04-14 ENCOUNTER — Telehealth: Payer: Self-pay | Admitting: Emergency Medicine

## 2023-04-14 NOTE — Telephone Encounter (Signed)
Called patient in reference to lab results

## 2023-04-14 NOTE — Telephone Encounter (Signed)
Pt wants his nurse to give him a call about his recent results.

## 2023-04-14 NOTE — Telephone Encounter (Signed)
I think he has an appointment for annual exam coming up.  Will discuss results then.  Thanks.

## 2023-04-15 LAB — TESTT+TESTF+SHBG
Sex Hormone Binding: 40 nmol/L (ref 16.5–55.9)
Testosterone, Free: 7.2 pg/mL — ABNORMAL LOW (ref 8.7–25.1)
Testosterone, Total, LC/MS: 391.8 ng/dL (ref 264.0–916.0)
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
## 2023-08-23 ENCOUNTER — Telehealth: Payer: Self-pay | Admitting: Emergency Medicine

## 2023-08-25 ENCOUNTER — Ambulatory Visit: Payer: Managed Care, Other (non HMO) | Admitting: Emergency Medicine

## 2023-09-01 NOTE — Telephone Encounter (Signed)
Copied from CRM [id number]. Topic: Clinical - Prescription Issue >> Sep 01, 2023 11:00 AM Fonda Kinder J wrote: Reason for CRM: Pt was recent refill request was only ordered a quantity of 2 but he is to be prescribed a quantity of 20. And he is also requesting a refill of 20 so he doesn't have to keep calling for them

## 2023-09-02 ENCOUNTER — Encounter: Payer: Self-pay | Admitting: Radiology

## 2023-09-02 MED ORDER — RIZATRIPTAN BENZOATE 10 MG PO TABS: 10.0000 mg | ORAL_TABLET | ORAL | 3 refills | Status: DC | PRN

## 2023-09-02 NOTE — Telephone Encounter (Signed)
New prescription sent to pharmacy of record today.  Thanks.

## 2023-09-02 NOTE — Addendum Note (Signed)
Addended by: Evie Lacks on: 09/02/2023 11:21 AM   Modules accepted: Orders

## 2023-10-13 ENCOUNTER — Encounter: Payer: Self-pay | Admitting: Emergency Medicine

## 2023-10-15 ENCOUNTER — Other Ambulatory Visit: Payer: Self-pay | Admitting: Emergency Medicine

## 2023-10-15 MED ORDER — ACYCLOVIR 800 MG PO TABS: 800.0000 mg | ORAL_TABLET | Freq: Four times a day (QID) | ORAL | 3 refills | Status: AC

## 2023-10-15 NOTE — Telephone Encounter (Signed)
 New prescription for acyclovir 90 count sent to pharmacy of record today.  Thanks.

## 2023-11-08 ENCOUNTER — Other Ambulatory Visit: Payer: Self-pay | Admitting: Emergency Medicine

## 2023-11-08 MED ORDER — CLOTRIMAZOLE-BETAMETHASONE 1-0.05 % EX CREA: 1.0000 | TOPICAL_CREAM | Freq: Every day | CUTANEOUS | 1 refills | Status: AC

## 2023-11-08 NOTE — Telephone Encounter (Signed)
 New prescription for Lotrisone  sent to pharmacy of record today.

## 2023-11-28 NOTE — Telephone Encounter (Signed)
 Not taking any new patients.  She needs to establish care with one of our male providers.

## 2023-12-08 ENCOUNTER — Encounter: Payer: Self-pay | Admitting: Emergency Medicine

## 2023-12-08 NOTE — Telephone Encounter (Signed)
 Patient has been scheduled for 5/28 @1pm . Patient states if his schedule changes he will call back and see if he can schedule for something sooner

## 2023-12-14 ENCOUNTER — Ambulatory Visit: Admitting: Emergency Medicine

## 2024-01-09 ENCOUNTER — Other Ambulatory Visit: Payer: Self-pay | Admitting: Emergency Medicine

## 2024-02-15 ENCOUNTER — Telehealth: Admitting: Physician Assistant

## 2024-02-15 DIAGNOSIS — S3992XA Unspecified injury of lower back, initial encounter: Secondary | ICD-10-CM | POA: Diagnosis not present

## 2024-02-15 MED ORDER — MELOXICAM 7.5 MG PO TABS
15.0000 mg | ORAL_TABLET | Freq: Every day | ORAL | 0 refills | Status: AC
Start: 2024-02-15 — End: 2024-02-22

## 2024-02-15 NOTE — Patient Instructions (Addendum)
  Cody Bell, thank you for joining Cody GORMAN Snuffer, PA-C for today's virtual visit.  While this provider is not your primary care provider (PCP), if your PCP is located in our provider database this encounter information will be shared with them immediately following your visit.   A [facility] MyChart account gives you access to today's visit and all your visits, tests, and labs performed at [facility]  click here if you don't have a [facility] MyChart account or go to [email or web address]  Consent: (Patient) Cody Bell provided verbal consent for this virtual visit at the beginning of the encounter.  Current Medications:  Current Outpatient Medications:    acyclovir  (ZOVIRAX ) 800 MG tablet, Take 1 tablet (800 mg total) by mouth 4 (four) times daily., Disp: 90 tablet, Rfl: 3   clotrimazole -betamethasone  (LOTRISONE ) cream, Apply 1 Application topically daily., Disp: 45 g, Rfl: 1   rizatriptan  (MAXALT ) 10 MG tablet, TAKE 1 TABLET BY MOUTH AS NEEDED FOR MIGRAINE HEADACHE (MAR  REPEAT  IN  2  HOURS  IF  NEEDED.), Disp: 12 tablet, Rfl: 0   SSD 1 % cream, APPLY TOPICALLY ONCE DAILY, Disp: 50 g, Rfl: 0   Medications ordered in this encounter:  No orders of the defined types were placed in this encounter.    *If you need refills on other medications prior to your next appointment, please contact your pharmacy*  Follow-Up: Call back or seek an in-person evaluation if the symptoms worsen or if the condition fails to improve as anticipated.  [facility] [phone]  Other Instructions Take meloxicam  as directed. You can also take 650mg  of tylenol  every 6 hours. Ice the area with a cold compress for 20 minute at a time and sit on a donut for comfort for the next 2-4 weeks.  Follow up with your regular doctor in 1 week for reassessment and seek care sooner if your symptoms worsen or fail to improve.  If you have been instructed to have an in-person  evaluation today at a local Urgent Care facility, please use the link below. It will take you to a list of all of our available [facility], including address, phone number and hours of operation. Please do not delay care.  [facility]  If you or a family member do not have a primary care provider, use the link below to schedule a visit and establish care. When you choose a [facility] primary care physician or advanced practice provider, you gain a long-term partner in health. Find a Primary Care Provider  Learn more about [facility] in-office and virtual care options: [facility] - Get Care Now
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
## 2024-04-27 ENCOUNTER — Telehealth: Admitting: Family Medicine

## 2024-04-27 NOTE — Progress Notes (Signed)
 Unable to prescribe testosterone. Pt notified. No visit-DWB

## 2024-07-16 ENCOUNTER — Other Ambulatory Visit: Payer: Self-pay | Admitting: Emergency Medicine
